# Patient Record
Sex: Female | Born: 1945 | Race: White | Hispanic: No | Marital: Married | Smoking: Current every day smoker
Health system: Southern US, Community
[De-identification: ages and names within clinical notes are randomized; demographics above are authoritative.]

## PROBLEM LIST (undated history)

## (undated) DIAGNOSIS — I10 Essential (primary) hypertension: Secondary | ICD-10-CM

## (undated) DIAGNOSIS — J449 Chronic obstructive pulmonary disease, unspecified: Secondary | ICD-10-CM

## (undated) DIAGNOSIS — G8929 Other chronic pain: Secondary | ICD-10-CM

## (undated) HISTORY — PX: FOOT SURGERY: SHX648

---

## 2004-05-12 ENCOUNTER — Emergency Department: Payer: Self-pay | Admitting: Emergency Medicine

## 2004-05-13 ENCOUNTER — Ambulatory Visit: Payer: Self-pay | Admitting: Family Medicine

## 2004-09-16 ENCOUNTER — Ambulatory Visit: Payer: Self-pay | Admitting: Anesthesiology

## 2004-10-11 ENCOUNTER — Ambulatory Visit: Payer: Self-pay | Admitting: Anesthesiology

## 2004-10-14 ENCOUNTER — Ambulatory Visit: Payer: Self-pay | Admitting: Anesthesiology

## 2004-11-11 ENCOUNTER — Emergency Department: Payer: Self-pay | Admitting: Emergency Medicine

## 2004-12-10 ENCOUNTER — Ambulatory Visit: Payer: Self-pay | Admitting: Anesthesiology

## 2005-01-06 ENCOUNTER — Ambulatory Visit: Payer: Self-pay | Admitting: Anesthesiology

## 2005-01-08 ENCOUNTER — Ambulatory Visit: Payer: Self-pay | Admitting: Anesthesiology

## 2005-02-12 ENCOUNTER — Ambulatory Visit: Payer: Self-pay | Admitting: Anesthesiology

## 2005-02-19 ENCOUNTER — Ambulatory Visit: Payer: Self-pay | Admitting: Anesthesiology

## 2005-03-27 ENCOUNTER — Ambulatory Visit: Payer: Self-pay | Admitting: Anesthesiology

## 2005-05-03 ENCOUNTER — Emergency Department: Payer: Self-pay | Admitting: General Practice

## 2005-05-03 ENCOUNTER — Other Ambulatory Visit: Payer: Self-pay

## 2005-06-19 ENCOUNTER — Ambulatory Visit: Payer: Self-pay | Admitting: Anesthesiology

## 2005-08-07 ENCOUNTER — Ambulatory Visit: Payer: Self-pay | Admitting: Anesthesiology

## 2005-10-30 ENCOUNTER — Ambulatory Visit: Payer: Self-pay | Admitting: Anesthesiology

## 2005-12-24 ENCOUNTER — Ambulatory Visit: Payer: Self-pay | Admitting: Anesthesiology

## 2006-01-01 ENCOUNTER — Ambulatory Visit: Payer: Self-pay | Admitting: Gastroenterology

## 2006-01-21 ENCOUNTER — Ambulatory Visit: Payer: Self-pay | Admitting: Anesthesiology

## 2006-07-28 ENCOUNTER — Ambulatory Visit: Payer: Self-pay | Admitting: Anesthesiology

## 2006-08-27 ENCOUNTER — Ambulatory Visit: Payer: Self-pay | Admitting: Anesthesiology

## 2006-10-08 ENCOUNTER — Ambulatory Visit: Payer: Self-pay | Admitting: Anesthesiology

## 2006-11-03 ENCOUNTER — Ambulatory Visit: Payer: Self-pay | Admitting: Anesthesiology

## 2006-12-09 ENCOUNTER — Ambulatory Visit: Payer: Self-pay | Admitting: Anesthesiology

## 2007-04-30 ENCOUNTER — Ambulatory Visit: Payer: Self-pay | Admitting: Anesthesiology

## 2007-06-07 ENCOUNTER — Ambulatory Visit: Payer: Self-pay | Admitting: Anesthesiology

## 2007-06-28 ENCOUNTER — Ambulatory Visit: Payer: Self-pay | Admitting: Anesthesiology

## 2007-07-12 ENCOUNTER — Emergency Department: Payer: Self-pay | Admitting: Emergency Medicine

## 2007-12-01 ENCOUNTER — Ambulatory Visit: Payer: Self-pay | Admitting: Anesthesiology

## 2007-12-22 ENCOUNTER — Ambulatory Visit: Payer: Self-pay | Admitting: Anesthesiology

## 2008-01-17 ENCOUNTER — Ambulatory Visit: Payer: Self-pay | Admitting: Anesthesiology

## 2008-02-14 ENCOUNTER — Ambulatory Visit: Payer: Self-pay | Admitting: Anesthesiology

## 2008-03-16 ENCOUNTER — Ambulatory Visit: Payer: Self-pay | Admitting: Anesthesiology

## 2008-09-18 ENCOUNTER — Ambulatory Visit: Payer: Self-pay | Admitting: Anesthesiology

## 2008-11-01 ENCOUNTER — Ambulatory Visit: Payer: Self-pay | Admitting: Anesthesiology

## 2008-11-29 ENCOUNTER — Ambulatory Visit: Payer: Self-pay | Admitting: Anesthesiology

## 2009-06-18 ENCOUNTER — Ambulatory Visit: Payer: Self-pay | Admitting: Anesthesiology

## 2009-08-02 ENCOUNTER — Ambulatory Visit: Payer: Self-pay | Admitting: Anesthesiology

## 2009-09-14 ENCOUNTER — Ambulatory Visit: Payer: Self-pay | Admitting: Anesthesiology

## 2009-11-08 ENCOUNTER — Ambulatory Visit: Payer: Self-pay | Admitting: Anesthesiology

## 2010-04-17 ENCOUNTER — Ambulatory Visit: Payer: Self-pay | Admitting: Anesthesiology

## 2010-05-24 ENCOUNTER — Ambulatory Visit: Payer: Self-pay | Admitting: Anesthesiology

## 2010-07-01 ENCOUNTER — Ambulatory Visit: Payer: Self-pay | Admitting: Anesthesiology

## 2010-08-01 ENCOUNTER — Ambulatory Visit: Payer: Self-pay | Admitting: Anesthesiology

## 2011-01-31 ENCOUNTER — Ambulatory Visit: Payer: Self-pay | Admitting: Anesthesiology

## 2011-03-11 ENCOUNTER — Ambulatory Visit: Payer: Self-pay | Admitting: Anesthesiology

## 2011-04-04 ENCOUNTER — Ambulatory Visit: Payer: Self-pay | Admitting: Anesthesiology

## 2011-10-01 ENCOUNTER — Ambulatory Visit: Payer: Self-pay | Admitting: Anesthesiology

## 2011-11-14 ENCOUNTER — Ambulatory Visit: Payer: Self-pay | Admitting: Anesthesiology

## 2012-01-20 ENCOUNTER — Ambulatory Visit: Payer: Self-pay | Admitting: Anesthesiology

## 2012-03-22 ENCOUNTER — Other Ambulatory Visit: Payer: Self-pay

## 2012-03-22 DIAGNOSIS — R410 Disorientation, unspecified: Secondary | ICD-10-CM

## 2012-03-26 ENCOUNTER — Ambulatory Visit
Admission: RE | Admit: 2012-03-26 | Discharge: 2012-03-26 | Disposition: A | Discharge status: Home / Self Care | Payer: Medicare PPO | Source: Ambulatory Visit

## 2012-03-26 DIAGNOSIS — R410 Disorientation, unspecified: Secondary | ICD-10-CM

## 2012-03-26 MED ORDER — GADOBENATE DIMEGLUMINE 529 MG/ML IV SOLN
8.0000 mL | Freq: Once | INTRAVENOUS | Status: AC | PRN
  Administered 2012-03-26: 8 mL via INTRAVENOUS

## 2012-04-26 ENCOUNTER — Ambulatory Visit: Payer: Self-pay | Admitting: Anesthesiology

## 2012-08-18 ENCOUNTER — Ambulatory Visit: Payer: Self-pay | Admitting: Anesthesiology

## 2012-11-16 ENCOUNTER — Ambulatory Visit: Payer: Self-pay | Admitting: Anesthesiology

## 2013-01-11 ENCOUNTER — Emergency Department: Payer: Self-pay | Admitting: Unknown Physician Specialty

## 2013-01-11 LAB — COMPREHENSIVE METABOLIC PANEL
Albumin: 3 g/dL — ABNORMAL LOW (ref 3.4–5.0)
BUN: 6 mg/dL — ABNORMAL LOW (ref 7–18)
Calcium, Total: 9.8 mg/dL (ref 8.5–10.1)
Creatinine: 0.55 mg/dL — ABNORMAL LOW (ref 0.60–1.30)
EGFR (African American): >60
Potassium: 3.4 mmol/L — ABNORMAL LOW (ref 3.5–5.1)
Sodium: 142 mmol/L (ref 136–145)
Total Protein: 7.1 g/dL (ref 6.4–8.2)

## 2013-01-11 LAB — URINALYSIS, COMPLETE
Nitrite: NEGATIVE
Protein: NEGATIVE
RBC,UR: <1 /HPF (ref 0–5)
Specific Gravity: 1.003 (ref 1.003–1.030)
WBC UR: 2 /HPF (ref 0–5)

## 2013-01-11 LAB — CK TOTAL AND CKMB (NOT AT ARMC)
CK, Total: 45 U/L (ref 21–215)
CK-MB: 1 ng/mL (ref 0.5–3.6)

## 2013-01-11 LAB — CBC
HCT: 32.9 % — ABNORMAL LOW (ref 35.0–47.0)
MCH: 27.5 pg (ref 26.0–34.0)
MCHC: 33.9 g/dL (ref 32.0–36.0)
MCV: 81 fL (ref 80–100)
Platelet: 153 10*3/uL (ref 150–440)

## 2013-01-11 LAB — TROPONIN I: Troponin-I: <0.02 ng/mL

## 2013-02-28 ENCOUNTER — Ambulatory Visit: Payer: Self-pay | Admitting: Anesthesiology

## 2013-08-04 ENCOUNTER — Ambulatory Visit: Payer: Self-pay | Admitting: Anesthesiology

## 2013-09-04 IMAGING — CR DG CHEST 1V PORT
1 series · 1 of 1 positions shown · non-contrast
Comparison: none

REASON FOR EXAM: copd
COMMENTS:

[ap]
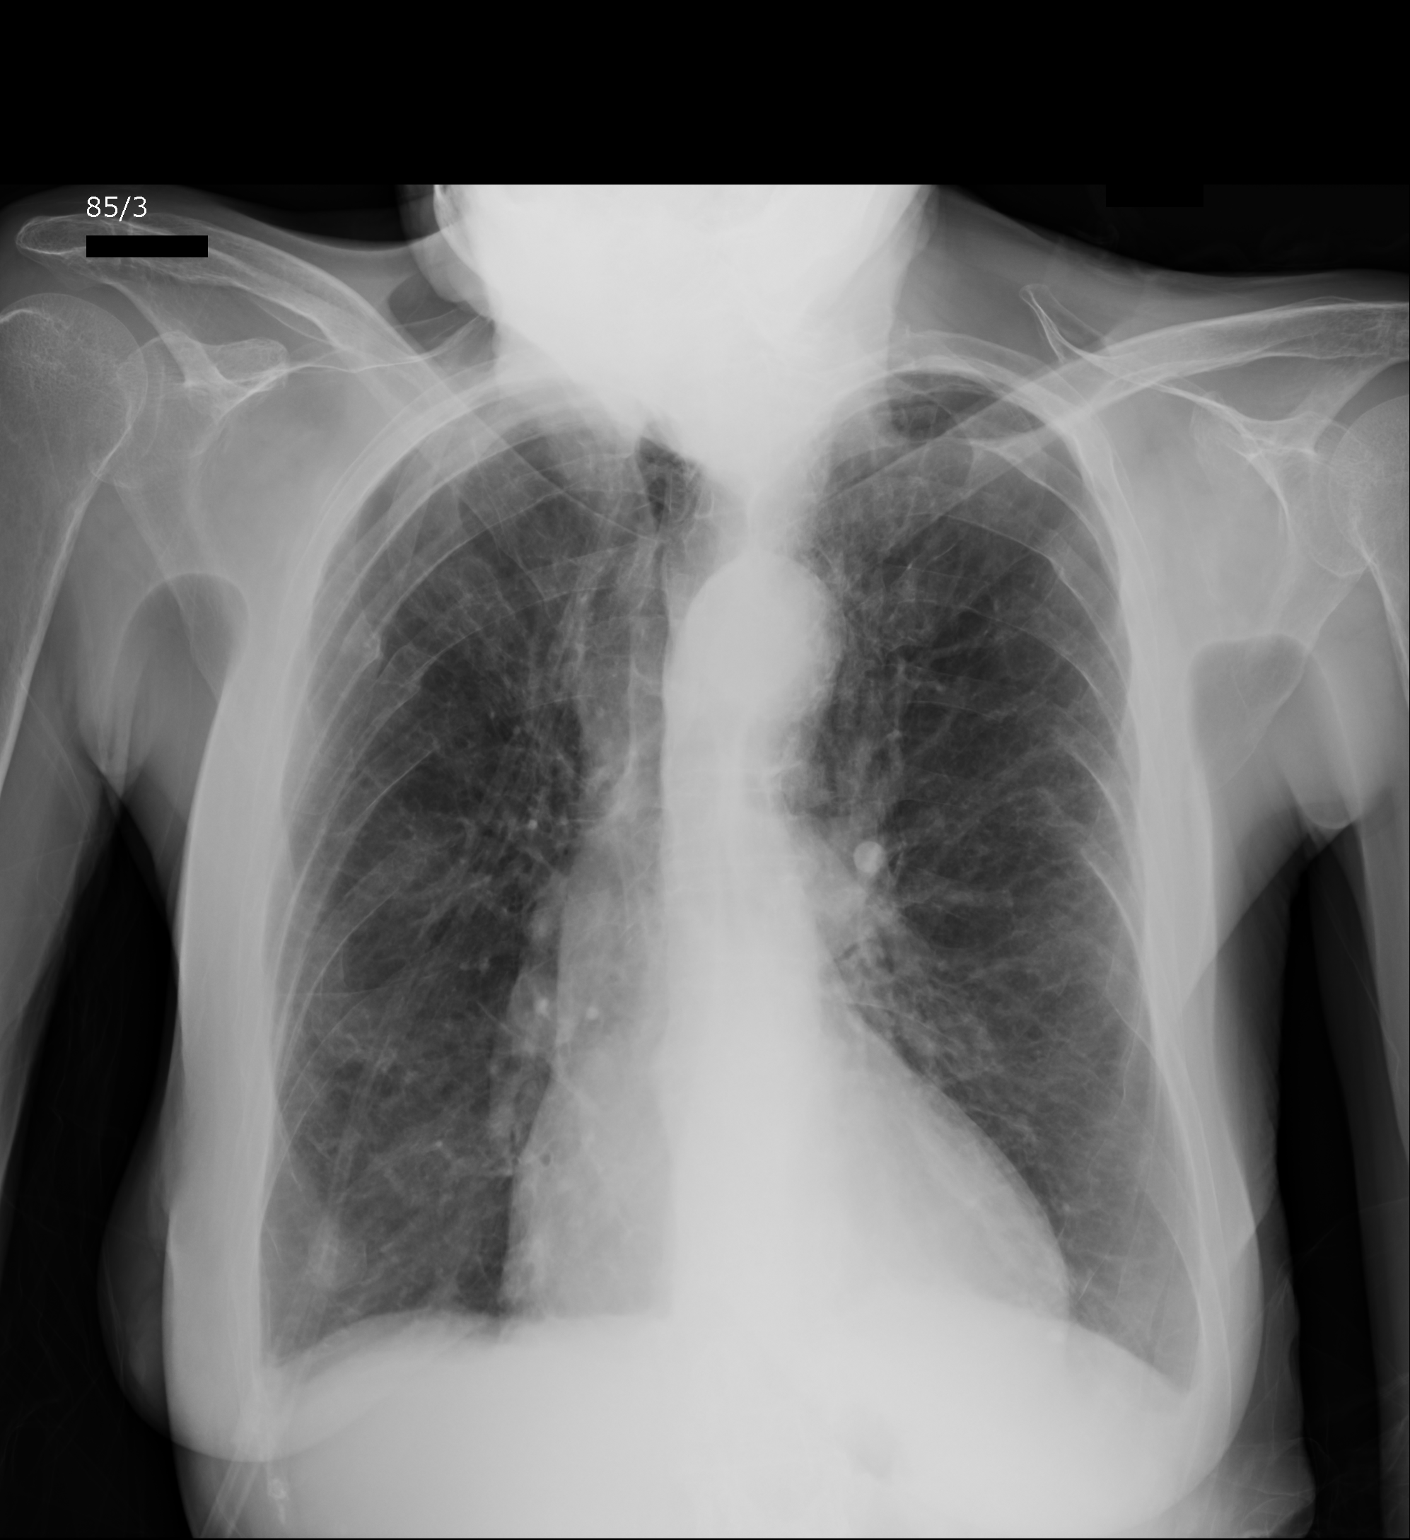

[1 of 1 positions shown; findings below may reference images not displayed]

PROCEDURE:     DXR - DXR PORTABLE CHEST SINGLE VIEW  - January 11, 2013  [DATE]

RESULT:     The lungs are hyperinflated. The interstitial markings are
increased. There are old rib fractures bilaterally. The cardiac silhouette
is top normal in size. The pulmonary vascularity is not clearly engorged.
There is no significant pleural fluid collection demonstrated.
IMPRESSION: The findings are consistent with COPD. I cannot exclude
acute bronchitis in the appropriate clinical setting. There is no focal
pneumonia.

[REDACTED]

## 2013-10-13 ENCOUNTER — Ambulatory Visit: Payer: Self-pay | Admitting: Anesthesiology

## 2014-02-14 ENCOUNTER — Ambulatory Visit: Payer: Self-pay | Admitting: Anesthesiology

## 2014-04-05 ENCOUNTER — Ambulatory Visit: Payer: Self-pay | Admitting: Anesthesiology

## 2014-07-15 ENCOUNTER — Emergency Department: Payer: Self-pay | Admitting: Internal Medicine

## 2014-07-15 LAB — CBC
HCT: 39.5 % (ref 35.0–47.0)
HGB: 13 g/dL (ref 12.0–16.0)
MCH: 31 pg (ref 26.0–34.0)
MCHC: 33 g/dL (ref 32.0–36.0)
MCV: 94 fL (ref 80–100)
PLATELETS: 374 10*3/uL (ref 150–440)
RBC: 4.21 10*6/uL (ref 3.80–5.20)
RDW: 13.8 % (ref 11.5–14.5)
WBC: 8.2 10*3/uL (ref 3.6–11.0)

## 2014-07-15 LAB — DRUG SCREEN, URINE

## 2014-07-15 LAB — COMPREHENSIVE METABOLIC PANEL
ALK PHOS: 314 U/L — AB
ANION GAP: 5 — AB (ref 7–16)
Albumin: 3.7 g/dL (ref 3.4–5.0)
BILIRUBIN TOTAL: 0.5 mg/dL (ref 0.2–1.0)
BUN: 9 mg/dL (ref 7–18)
CALCIUM: 10.6 mg/dL — AB (ref 8.5–10.1)
CHLORIDE: 103 mmol/L (ref 98–107)
CO2: 32 mmol/L (ref 21–32)
Creatinine: 0.78 mg/dL (ref 0.60–1.30)
Glucose: 107 mg/dL — ABNORMAL HIGH (ref 65–99)
OSMOLALITY: 279 (ref 275–301)
Potassium: 3.5 mmol/L (ref 3.5–5.1)
SGOT(AST): 25 U/L (ref 15–37)
SGPT (ALT): 16 U/L
SODIUM: 140 mmol/L (ref 136–145)
Total Protein: 8.7 g/dL — ABNORMAL HIGH (ref 6.4–8.2)

## 2014-07-15 LAB — URINALYSIS, COMPLETE
BACTERIA: NONE SEEN
BLOOD: NEGATIVE
Bilirubin,UR: NEGATIVE
Glucose,UR: NEGATIVE mg/dL (ref 0–75)
KETONE: NEGATIVE
Leukocyte Esterase: NEGATIVE
NITRITE: NEGATIVE
Ph: 5 (ref 4.5–8.0)
Protein: NEGATIVE
RBC,UR: <1 /HPF (ref 0–5)
SPECIFIC GRAVITY: 1.01 (ref 1.003–1.030)
Squamous Epithelial: NONE SEEN

## 2014-07-15 LAB — SALICYLATE LEVEL: Salicylates, Serum: 1.9 mg/dL

## 2014-07-15 LAB — ACETAMINOPHEN LEVEL

## 2014-07-15 LAB — ETHANOL: Ethanol: <3 mg/dL

## 2014-11-08 NOTE — Consult Note (Signed)
PATIENT NAME:  Carolyn Malone, Carolyn Malone MR#:  [id number] DATE OF BIRTH:  07-04-1946  DATE OF CONSULTATION:  07/15/2014  CONSULTING PHYSICIAN:  Audery Amel, MD  IDENTIFYING INFORMATION AND REASON FOR CONSULTATION: A 69 year old woman brought in voluntarily to the hospital by her family with concerns about depression.   CHIEF COMPLAINT: "I'm not feeling too good."   HISTORY OF PRESENT ILLNESS: Information obtained from the patient and the chart. The patient states that her husband died on Aug 03, 2023. She is having a hard time adjusting. She says that she has been feeling sad and overwhelmed. She has not been sleeping well. Has not been eating well. She has had the occasional auditory hallucination of her husband speaking, but has insight into the unreality of this. She denies that she has been having any suicidal thoughts at all. Does not want to die. Feels committed to her family. Does not have any homicidal ideation. She has been taking medication prescribed by her primary care doctor, but has not been back to see the primary care doctor since her husband passed away.   PAST PSYCHIATRIC HISTORY: Says that she has been feeling bad for a while and her primary care doctor has tried to get her to see a psychiatrist and she has resisted somewhat until now. Denies any history of psychiatric hospitalization. Denies any history of suicide attempts.   FAMILY HISTORY: Knows of no family history of mental illness.   SOCIAL HISTORY: She is living with extended family, including her daughter and granddaughter. Husband died just a week ago.   SUBSTANCE ABUSE HISTORY: Denies alcohol or drug use. No past history known of alcohol or drug use.   PAST MEDICAL HISTORY: COPD, underweight. High blood pressure. Also, chronic pain and has been followed in the chronic pain clinic for a long time.   CURRENT MEDICATIONS: Hydrocodone/acetaminophen 5 mg 1 tablet twice a day as needed, ibuprofen 800 mg 3 times a day x 5 days only,  enalapril 2.5 mg once a day, clonazepam 0.5 mg once a day as needed, atenolol 25 mg once a day at night, Advair Diskus 250 mcg 2 times inhaled twice a day, Spiriva HandiHaler inhaled once a day, Norvasc 10 mg once a day, ranitidine 75 mg twice a day, oxygen normally at home 2 liters.   ALLERGIES: No known drug allergies.   REVIEW OF SYSTEMS: Sad mood, poor sleep, poor appetite. No suicidal or homicidal ideation.   MENTAL STATUS EXAMINATION: Chronically ill woman, somewhat disheveled, interviewed in a hospital bed. Eye contact good. Psychomotor activity slow and limited. Speech however was normal in rate, tone and volume. Affect was reactive, mildly dysphoric but not tearful or overwhelmed. Mood was stated as not so good. Thoughts are lucid. No evidence of loosening of associations or delusions. Has had 1 or 2 episodes of hearing her husband's voice. No other hallucinations. Denies suicidal or homicidal ideation completely. She is alert and oriented x 4. Able to repeat 3 objects immediately and remembers 2/3 at 3 minutes. Judgment and insight appears to be intact. Normal intelligence.   LABORATORY RESULTS: Drug screen positive for tricyclic antidepressants and benzodiazepines. Negative for any opiates. Urinalysis negative. Salicylates normal. CBC normal. Alcohol negative. Chemistry panel: Elevated alkaline 314, elevated total protein. Acetaminophen negative.   VITAL SIGNS: Blood pressure 156/79, respirations 17, pulse 92, temperature 96.8.   ASSESSMENT: This is a 69 year old woman who has symptoms of depression and grieving, to be expected after her husband died. She admits that she has not been  eating well and has not been sleeping well. She totally denies any suicidal ideation. She is lucid with no sign of psychosis. She states that she knows she needs to be eating better and plans to start eating well. She is planning to go see her primary care doctor in the next few days.   TREATMENT PLAN: The  patient does not require inpatient hospitalization for psychiatric illness, does not meet commitment criteria. Psychoeducation and supportive counseling done including the strong recommendation that she needs to be eating and drinking better and following up with her primary care doctor. From a psychiatric standpoint, she does not require enforced hospitalization; however, I have discussed the case with the emergency room doctor who can evaluate whether there are more concerns about her weight.   DIAGNOSIS, PRINCIPAL AND PRIMARY:   AXIS I:  1. Depression, not otherwise specified.  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   AXIS II: Deferred.   AXIS III:  1. Underweight.  2. Chronic pain.  3. Hypertension.     ____________________________ Audery AmelJohn T. Windle Huebert, MD jtc:TT D: 07/15/2014 19:06:22 ET T: 07/15/2014 19:34:11 ET JOB#: [id number]  cc: Audery AmelJohn T. Reizy Dunlow, MD, <Dictator> Audery AmelJOHN T Allisen Pidgeon MD ELECTRONICALLY SIGNED 07/17/2014 15:16

## 2015-04-04 ENCOUNTER — Encounter: Payer: Self-pay | Admitting: *Deleted

## 2015-04-04 ENCOUNTER — Inpatient Hospital Stay
Admission: EM | Admit: 2015-04-04 | Discharge: 2015-04-14 | DRG: 871 | Disposition: E | Payer: Medicare PPO | Attending: Internal Medicine | Admitting: Internal Medicine

## 2015-04-04 ENCOUNTER — Emergency Department: Payer: Medicare PPO

## 2015-04-04 DIAGNOSIS — E785 Hyperlipidemia, unspecified: Secondary | ICD-10-CM | POA: Diagnosis present

## 2015-04-04 DIAGNOSIS — Z515 Encounter for palliative care: Secondary | ICD-10-CM

## 2015-04-04 DIAGNOSIS — Z79899 Other long term (current) drug therapy: Secondary | ICD-10-CM

## 2015-04-04 DIAGNOSIS — E872 Acidosis, unspecified: Secondary | ICD-10-CM | POA: Diagnosis present

## 2015-04-04 DIAGNOSIS — E162 Hypoglycemia, unspecified: Secondary | ICD-10-CM | POA: Diagnosis present

## 2015-04-04 DIAGNOSIS — F419 Anxiety disorder, unspecified: Secondary | ICD-10-CM | POA: Diagnosis present

## 2015-04-04 DIAGNOSIS — G9341 Metabolic encephalopathy: Secondary | ICD-10-CM | POA: Diagnosis present

## 2015-04-04 DIAGNOSIS — I10 Essential (primary) hypertension: Secondary | ICD-10-CM | POA: Diagnosis present

## 2015-04-04 DIAGNOSIS — R6521 Severe sepsis with septic shock: Secondary | ICD-10-CM | POA: Diagnosis present

## 2015-04-04 DIAGNOSIS — E875 Hyperkalemia: Secondary | ICD-10-CM | POA: Diagnosis present

## 2015-04-04 DIAGNOSIS — R680 Hypothermia, not associated with low environmental temperature: Secondary | ICD-10-CM | POA: Diagnosis present

## 2015-04-04 DIAGNOSIS — D72829 Elevated white blood cell count, unspecified: Secondary | ICD-10-CM

## 2015-04-04 DIAGNOSIS — K219 Gastro-esophageal reflux disease without esophagitis: Secondary | ICD-10-CM | POA: Diagnosis present

## 2015-04-04 DIAGNOSIS — Z9889 Other specified postprocedural states: Secondary | ICD-10-CM

## 2015-04-04 DIAGNOSIS — T68XXXA Hypothermia, initial encounter: Secondary | ICD-10-CM | POA: Diagnosis present

## 2015-04-04 DIAGNOSIS — F17210 Nicotine dependence, cigarettes, uncomplicated: Secondary | ICD-10-CM | POA: Diagnosis present

## 2015-04-04 DIAGNOSIS — A483 Toxic shock syndrome: Secondary | ICD-10-CM

## 2015-04-04 DIAGNOSIS — Z809 Family history of malignant neoplasm, unspecified: Secondary | ICD-10-CM

## 2015-04-04 DIAGNOSIS — J9621 Acute and chronic respiratory failure with hypoxia: Secondary | ICD-10-CM | POA: Diagnosis present

## 2015-04-04 DIAGNOSIS — G8929 Other chronic pain: Secondary | ICD-10-CM | POA: Diagnosis present

## 2015-04-04 DIAGNOSIS — J189 Pneumonia, unspecified organism: Secondary | ICD-10-CM | POA: Diagnosis present

## 2015-04-04 DIAGNOSIS — I447 Left bundle-branch block, unspecified: Secondary | ICD-10-CM | POA: Diagnosis present

## 2015-04-04 DIAGNOSIS — Z7951 Long term (current) use of inhaled steroids: Secondary | ICD-10-CM

## 2015-04-04 DIAGNOSIS — N179 Acute kidney failure, unspecified: Secondary | ICD-10-CM | POA: Diagnosis present

## 2015-04-04 DIAGNOSIS — J9600 Acute respiratory failure, unspecified whether with hypoxia or hypercapnia: Secondary | ICD-10-CM | POA: Diagnosis present

## 2015-04-04 DIAGNOSIS — Z9981 Dependence on supplemental oxygen: Secondary | ICD-10-CM

## 2015-04-04 DIAGNOSIS — A419 Sepsis, unspecified organism: Secondary | ICD-10-CM | POA: Diagnosis not present

## 2015-04-04 DIAGNOSIS — Z66 Do not resuscitate: Secondary | ICD-10-CM | POA: Diagnosis present

## 2015-04-04 DIAGNOSIS — N170 Acute kidney failure with tubular necrosis: Secondary | ICD-10-CM | POA: Diagnosis present

## 2015-04-04 DIAGNOSIS — N19 Unspecified kidney failure: Secondary | ICD-10-CM | POA: Diagnosis not present

## 2015-04-04 DIAGNOSIS — J449 Chronic obstructive pulmonary disease, unspecified: Secondary | ICD-10-CM | POA: Diagnosis present

## 2015-04-04 DIAGNOSIS — Z833 Family history of diabetes mellitus: Secondary | ICD-10-CM

## 2015-04-04 HISTORY — DX: Chronic obstructive pulmonary disease, unspecified: J44.9

## 2015-04-04 HISTORY — DX: Other chronic pain: G89.29

## 2015-04-04 HISTORY — DX: Essential (primary) hypertension: I10

## 2015-04-04 LAB — BASIC METABOLIC PANEL
Anion gap: 18 — ABNORMAL HIGH (ref 5–15)
BUN: 46 mg/dL — AB (ref 6–20)
CALCIUM: 9.3 mg/dL (ref 8.9–10.3)
CO2: 18 mmol/L — ABNORMAL LOW (ref 22–32)
Chloride: 106 mmol/L (ref 101–111)
Creatinine, Ser: 2.62 mg/dL — ABNORMAL HIGH (ref 0.44–1.00)
GFR calc Af Amer: 20 mL/min — ABNORMAL LOW (ref >60)
GFR, EST NON AFRICAN AMERICAN: 18 mL/min — AB (ref >60)
GLUCOSE: 142 mg/dL — AB (ref 65–99)
POTASSIUM: 6 mmol/L — AB (ref 3.5–5.1)
SODIUM: 142 mmol/L (ref 135–145)

## 2015-04-04 LAB — CBC WITH DIFFERENTIAL/PLATELET
Basophils Absolute: 0 10*3/uL (ref 0–0.1)
Basophils Relative: 0 %
Eosinophils Absolute: 0 10*3/uL (ref 0–0.7)
Eosinophils Relative: 0 %
HEMATOCRIT: 38.7 % (ref 35.0–47.0)
HEMOGLOBIN: 12 g/dL (ref 12.0–16.0)
LYMPHS ABS: 1.6 10*3/uL (ref 1.0–3.6)
LYMPHS PCT: 7 %
MCH: 30.4 pg (ref 26.0–34.0)
MCHC: 31 g/dL — AB (ref 32.0–36.0)
MCV: 97.9 fL (ref 80.0–100.0)
MONOS PCT: 5 %
Monocytes Absolute: 1.1 10*3/uL — ABNORMAL HIGH (ref 0.2–0.9)
NEUTROS ABS: 20.6 10*3/uL — AB (ref 1.4–6.5)
NEUTROS PCT: 88 %
Platelets: 198 10*3/uL (ref 150–440)
RBC: 3.95 MIL/uL (ref 3.80–5.20)
RDW: 15.7 % — ABNORMAL HIGH (ref 11.5–14.5)
WBC: 23.3 10*3/uL — AB (ref 3.6–11.0)

## 2015-04-04 LAB — URINALYSIS COMPLETE WITH MICROSCOPIC (ARMC ONLY)
Bilirubin Urine: NEGATIVE
Glucose, UA: NEGATIVE mg/dL
KETONES UR: NEGATIVE mg/dL
Leukocytes, UA: NEGATIVE
Nitrite: NEGATIVE
PH: 5 (ref 5.0–8.0)
PROTEIN: 100 mg/dL — AB
SPECIFIC GRAVITY, URINE: 1.014 (ref 1.005–1.030)

## 2015-04-04 LAB — LACTIC ACID, PLASMA: LACTIC ACID, VENOUS: 8.2 mmol/L — AB (ref 0.5–2.0)

## 2015-04-04 LAB — BLOOD GAS, ARTERIAL
ACID-BASE DEFICIT: 13.9 mmol/L — AB (ref 0.0–2.0)
Allens test (pass/fail): POSITIVE — AB
Bicarbonate: 13.9 mEq/L — ABNORMAL LOW (ref 21.0–28.0)
O2 SAT: 41.2 %
PATIENT TEMPERATURE: 37
PCO2 ART: 38 mmHg (ref 32.0–48.0)
pH, Arterial: 7.17 — CL (ref 7.350–7.450)
pO2, Arterial: 31 mmHg — CL (ref 83.0–108.0)

## 2015-04-04 LAB — GLUCOSE, CAPILLARY
GLUCOSE-CAPILLARY: 71 mg/dL (ref 65–99)
GLUCOSE-CAPILLARY: 116 mg/dL — AB (ref 65–99)
Glucose-Capillary: 181 mg/dL — ABNORMAL HIGH (ref 65–99)

## 2015-04-04 MED ORDER — DEXTROSE 50 % IV SOLN: 25.0000 mL | INTRAVENOUS | Status: DC

## 2015-04-04 MED ORDER — VANCOMYCIN HCL IN DEXTROSE 1-5 GM/200ML-% IV SOLN
1000.0000 mg | Freq: Once | INTRAVENOUS | Status: AC
Start: 2015-04-04 — End: 2015-04-04
  Administered 2015-04-04: 1000 mg via INTRAVENOUS
  Filled 2015-04-04: qty 200

## 2015-04-04 MED ORDER — MORPHINE SULFATE (PF) 2 MG/ML IV SOLN: 2.0000 mg | INTRAVENOUS | Status: DC | PRN

## 2015-04-04 MED ORDER — PROCHLORPERAZINE 25 MG RE SUPP
25.0000 mg | Freq: Two times a day (BID) | RECTAL | Status: DC | PRN
  Filled 2015-04-04: qty 1

## 2015-04-04 MED ORDER — PIPERACILLIN-TAZOBACTAM 3.375 G IVPB: 3.3750 g | Freq: Two times a day (BID) | INTRAVENOUS | Status: DC

## 2015-04-04 MED ORDER — ACETAMINOPHEN 650 MG RE SUPP: 650.0000 mg | Freq: Four times a day (QID) | RECTAL | Status: DC | PRN

## 2015-04-04 MED ORDER — ALBUTEROL SULFATE (2.5 MG/3ML) 0.083% IN NEBU: 2.5000 mg | INHALATION_SOLUTION | RESPIRATORY_TRACT | Status: DC | PRN

## 2015-04-04 MED ORDER — SODIUM CHLORIDE 0.9 % IV BOLUS (SEPSIS)
1000.0000 mL | Freq: Once | INTRAVENOUS | Status: AC
  Administered 2015-04-04: 1000 mL via INTRAVENOUS

## 2015-04-04 MED ORDER — MORPHINE SULFATE (PF) 2 MG/ML IV SOLN: 1.0000 mg | INTRAVENOUS | Status: DC | PRN

## 2015-04-04 MED ORDER — PIPERACILLIN-TAZOBACTAM 3.375 G IVPB
3.3750 g | Freq: Once | INTRAVENOUS | Status: DC
  Administered 2015-04-04: 3.375 g via INTRAVENOUS
  Filled 2015-04-04: qty 50

## 2015-04-04 MED ORDER — MORPHINE SULFATE (CONCENTRATE) 10 MG/0.5ML PO SOLN: 5.0000 mg | ORAL | Status: DC | PRN

## 2015-04-04 MED ORDER — ALBUTEROL SULFATE HFA 108 (90 BASE) MCG/ACT IN AERS: 2.0000 | INHALATION_SPRAY | RESPIRATORY_TRACT | Status: DC | PRN

## 2015-04-04 MED ORDER — GLYCOPYRROLATE 0.2 MG/ML IJ SOLN
0.4000 mg | Freq: Four times a day (QID) | INTRAMUSCULAR | Status: DC | PRN
  Filled 2015-04-04: qty 2

## 2015-04-04 MED ORDER — SODIUM CHLORIDE 0.9 % IV SOLN
1.0000 g | INTRAVENOUS | Status: DC
  Filled 2015-04-04: qty 10

## 2015-04-04 MED ORDER — ONDANSETRON HCL 4 MG/2ML IJ SOLN: 4.0000 mg | Freq: Four times a day (QID) | INTRAMUSCULAR | Status: DC | PRN

## 2015-04-04 MED ORDER — LORAZEPAM 2 MG/ML IJ SOLN: 0.5000 mg | INTRAMUSCULAR | Status: DC | PRN

## 2015-04-04 MED ORDER — ONDANSETRON HCL 4 MG PO TABS: 4.0000 mg | ORAL_TABLET | Freq: Four times a day (QID) | ORAL | Status: DC | PRN

## 2015-04-04 MED ORDER — SODIUM CHLORIDE 0.9 % IV SOLN: INTRAVENOUS | Status: DC

## 2015-04-04 MED ORDER — INSULIN ASPART 100 UNIT/ML ~~LOC~~ SOLN: 10.0000 [IU] | SUBCUTANEOUS | Status: DC

## 2015-04-04 MED ORDER — HEPARIN SODIUM (PORCINE) 5000 UNIT/ML IJ SOLN: 5000.0000 [IU] | Freq: Three times a day (TID) | INTRAMUSCULAR | Status: DC

## 2015-04-04 MED ORDER — MOMETASONE FURO-FORMOTEROL FUM 200-5 MCG/ACT IN AERO
2.0000 | INHALATION_SPRAY | Freq: Two times a day (BID) | RESPIRATORY_TRACT | Status: DC
  Filled 2015-04-04: qty 8.8

## 2015-04-04 MED ORDER — IPRATROPIUM BROMIDE 0.02 % IN SOLN: 0.5000 mg | Freq: Four times a day (QID) | RESPIRATORY_TRACT | Status: DC | PRN

## 2015-04-04 MED ORDER — TIOTROPIUM BROMIDE MONOHYDRATE 18 MCG IN CAPS
18.0000 ug | ORAL_CAPSULE | Freq: Every day | RESPIRATORY_TRACT | Status: DC
  Filled 2015-04-04: qty 5

## 2015-04-04 MED ORDER — DEXTROSE 50 % IV SOLN
1.0000 | Freq: Once | INTRAVENOUS | Status: AC
  Administered 2015-04-04: 50 mL via INTRAVENOUS
  Filled 2015-04-04: qty 50

## 2015-04-04 MED ORDER — BISACODYL 10 MG RE SUPP: 10.0000 mg | Freq: Every day | RECTAL | Status: DC | PRN

## 2015-04-04 MED ORDER — ALBUTEROL SULFATE (2.5 MG/3ML) 0.083% IN NEBU: 2.5000 mg | INHALATION_SOLUTION | Freq: Three times a day (TID) | RESPIRATORY_TRACT | Status: DC

## 2015-04-04 MED ORDER — VANCOMYCIN HCL 500 MG IV SOLR: 500.0000 mg | INTRAVENOUS | Status: DC

## 2015-04-04 MED ORDER — ACETAMINOPHEN 325 MG PO TABS: 650.0000 mg | ORAL_TABLET | Freq: Four times a day (QID) | ORAL | Status: DC | PRN

## 2015-04-05 LAB — URINE CULTURE: Culture: NO GROWTH

## 2015-04-09 LAB — CULTURE, BLOOD (ROUTINE X 2)
CULTURE: NO GROWTH
Culture: NO GROWTH

## 2015-04-14 NOTE — Progress Notes (Signed)
Dr. Imogene Burn notified of pts death at 09-Dec-1703, chaplain paged, supervisor notified. Casimer Leek, RN 05-02-2015

## 2015-04-14 NOTE — Progress Notes (Signed)
   Apr 28, 2015 1420  Clinical Encounter Type  Visited With Patient and family together  Visit Type Initial;Spiritual support;Patient actively dying  Referral From Nurse  Consult/Referral To Chaplain  Spiritual Encounters  Spiritual Needs Sacred text;Prayer;Ritual;Emotional;Grief support  Stress Factors  Patient Stress Factors Health changes;Loss;Exhausted  Family Stress Factors Family relationships;Health changes;Loss;Major life changes  Met w/patient & family. Family acknowledged patient is actively dying and requested unction for the dying (last rites), prayer for family and grief care. Provided per family request.   Melven Sartorius. Margie Brink G. Winthrop

## 2015-04-14 NOTE — ED Notes (Signed)
Dr. Derrill Kay notified that nurse unable to doppler pedal pulses but able to doppler right popliteal and left femoral.  Dr. Derrill Kay also notified of continued hypotension and change in mental status.  Bair hugger applied and Warm IV fluids to be given.

## 2015-04-14 NOTE — Progress Notes (Signed)
As mentioned in H&P, the patient was admitted for septic shock, pneumonia, acute renal failure, hyperlipidemia, acute respiratory failure and hypothermia. The patient was treated with normal saline bolus, vancomycin and Zosyn in the ED. After Dr. Orvan Falconer discussed with the patient's family member, they decided comfort care only. The patient was put on comfort care with oxygen and morphine when necessary. She expired at 17:05 per RN.  Discharge diagnosis:  Septic shock Right lower lobe Pneumonia (CAP) Acute respiratory failure with hypoxia Acute metabolic encephalopathy Acute renal failure, possible ATN Hyperkalemia Acute acidosis Hypothermia

## 2015-04-14 NOTE — ED Notes (Signed)
Spoke with Dr. Derrill Kay about patients continued hypothermia, hypotension, and unable to obtain 02 stat.  Verbal orders received.

## 2015-04-14 NOTE — Consult Note (Signed)
Palliative Medicine Inpatient Consult Note   Name: Carolyn Malone Date: Apr 08, 2015 MRN: [id number]  DOB: 1945/07/22  Referring Physician: Demetrios Loll, MD  Palliative Care consult requested for this 69 y.o. female for goals of medical therapy in patient with severe acidosis due to pneumonia and metabolic derangement associated with acute renal failure.  She came into the ER with shortness of breath and is found to have labs and vital signs that are not consistent with staying alive. She is in hypotensive shock with an SBP around 50 or so.  She got a dose of Vanco and Zosyn in the ER and some fluids. Family had wanted to treat the high potassium, run fluids, and give ABX BUT pt is DNR and w/o intubation and pressors, these measures would not make any difference.  Her attending asked that I speak with family to clarify their concept of terminal comfort care and clarify what we should do, should not do, etc in this setting.   IMPRESSION Sepsis with Septic Shock ---severe sepsis present on admission ---likely due to pneumonia ---organism not known ---multi organ system failure underway due to sepsis with      -respiratory failure and renal failure and metabolic encephalopathy Pneumonia RLL Ongoing tobacco smoking Severe COPD  --on 4 LPM of oxygen at home and very debilitated from her COPD Acute on Chronic Respiratory Failure with Hypoxemia (has h/o Hypercapnia but this is not noted at admission) Essential HTN normally now in shock Hypoglycemia Hypothermia Leukocytosis due to sepsis Acute renal failure with Cr 2.62 Hyperkalemia Left Bundle Branch Block on EKG Chronic Pain on oral narcotics at home Anxiety disorder GERD  DISCUSSIONS, DECISIONS, AND PLAN: 1. Pt continues as DNR and DNI  2.  After I met with all three daughters and their spouses / children present today, the family is in agreement to 'let pt die naturally'. They no longer insist on fluids, ABX, potassium treatment, etc.  They  are accepting of her terminal condition and express comfort knowing she will not be suffering any longer as she has been suffering.  Small doses of morphine are discussed and no one is objecting to medications for comfort and to help with a sensation of smothering.  See revised orders.  Grim prognosis. I would not expect pt to survive the night.  However, she did get some boluses of fluid and enough ABX to stay in her system today, so it is hard to say if these measures will prolong her life, or not.     REVIEW OF SYSTEMS:  Patient is not able to provide ROS due to actively dying.  SPIRITUAL SUPPORT SYSTEM: Yes   -family and chaplain .  SOCIAL HISTORY:  reports that she has been smoking.  She does not have any smokeless tobacco history on file. She reports that she does not drink alcohol.  Lives with one daughter.  Performance status at home is very poor --able to walk but unable to open a door due to weakness.  On 4 LPM of O2 at baseline.  Sick with shortness of breath for 2-3 days. Had some intermittent vomiting with traces of blood in it yesterday per report.  LEGAL DOCUMENTS:  [name] DNR form came with pt  CODE STATUS: DNR  PAST MEDICAL HISTORY: Past Medical History  Diagnosis Date  . COPD (chronic obstructive pulmonary disease)   . Chronic pain   . Hypertension     PAST SURGICAL HISTORY:  Past Surgical History  Procedure Laterality Date  . Foot  surgery      ALLERGIES:  has No Known Allergies.  MEDICATIONS:  Current Facility-Administered Medications  Medication Dose Route Frequency Provider Last Rate Last Dose  . 0.9 %  sodium chloride infusion   Intravenous Continuous Colleen Can, MD      . bisacodyl (DULCOLAX) suppository 10 mg  10 mg Rectal Daily PRN Colleen Can, MD      . glycopyrrolate (ROBINUL) injection 0.4 mg  0.4 mg Intravenous Q6H PRN Colleen Can, MD      . LORazepam (ATIVAN) injection 0.5 mg  0.5 mg Intravenous Q4H PRN Colleen Can,  MD      . morphine 2 MG/ML injection 1 mg  1 mg Intravenous Q1H PRN Colleen Can, MD      . morphine 2 MG/ML injection 2 mg  2 mg Intravenous Q1H PRN Colleen Can, MD      . morphine CONCENTRATE 10 MG/0.5ML oral solution 5 mg  5 mg Oral Q2H PRN Colleen Can, MD      . ondansetron [facility]) injection 4 mg  4 mg Intravenous Q6H PRN Colleen Can, MD      . prochlorperazine (COMPAZINE) suppository 25 mg  25 mg Rectal Q12H PRN Colleen Can, MD       Current Outpatient Prescriptions  Medication Sig Dispense Refill  . acetaminophen (TYLENOL) 500 MG tablet Take 500 mg by mouth 2 (two) times daily.    Marland Kitchen albuterol (PROVENTIL HFA;VENTOLIN HFA) 108 (90 BASE) MCG/ACT inhaler Inhale 2 puffs into the lungs every 4 (four) hours as needed for wheezing or shortness of breath.    Marland Kitchen albuterol (PROVENTIL) (2.5 MG/3ML) 0.083% nebulizer solution Take 2.5 mg by nebulization 3 (three) times daily.    . diclofenac sodium (VOLTAREN) 1 % GEL Apply 2 g topically 4 (four) times daily as needed (back or leg pain).    . Fluticasone-Salmeterol (ADVAIR) 500-50 MCG/DOSE AEPB Inhale 1 puff into the lungs 2 (two) times daily.    Marland Kitchen gabapentin (NEURONTIN) 300 MG capsule Take 300 mg by mouth 2 (two) times daily. In the morning and at lunchtime    . hydrOXYzine (VISTARIL) 25 MG capsule Take 25 mg by mouth 3 (three) times daily as needed for anxiety.    Marland Kitchen ipratropium (ATROVENT) 0.02 % nebulizer solution Take 0.5 mg by nebulization every 6 (six) hours as needed for wheezing or shortness of breath.    . mirtazapine (REMERON) 30 MG tablet Take 30 mg by mouth at bedtime.    . nortriptyline (PAMELOR) 10 MG capsule Take 10 mg by mouth at bedtime.    Marland Kitchen OLANZapine (ZYPREXA) 2.5 MG tablet Take 2.5-5 mg by mouth 2 (two) times daily. 2.5 mg every morning, 5 mg at bedtime    . oxyCODONE (OXY IR/ROXICODONE) 5 MG immediate release tablet Take 5 mg by mouth daily as needed for severe pain.    . ranitidine (ZANTAC) 150  MG tablet Take 150 mg by mouth 2 (two) times daily.    Marland Kitchen tiotropium (SPIRIVA) 18 MCG inhalation capsule Place 18 mcg into inhaler and inhale daily.      Vital Signs: BP 60/39 mmHg  Pulse 65  Temp(Src) 95.8 F (35.4 C) (Rectal)  Resp 27  Ht 5' (1.524 m)  Wt 34.473 kg (76 lb)  BMI 14.84 kg/m2  SpO2 92% Filed Weights   Apr 20, 2015 1048  Weight: 34.473 kg (76 lb)    Estimated body mass index is 14.84 kg/(m^2) as calculated from the  following:   Height as of this encounter: 5' (1.524 m).   Weight as of this encounter: 34.473 kg (76 lb).  PERFORMANCE STATUS (ECOG) : 4 - Bedbound  PHYSICAL EXAM: Lying flat in bed w/ O2 face mask in place Unresponsive but limbs move from time to time Eyes closed OPdry NEck w/o JVD or TM Hrt rrr rate 70's --PVCs on tele and irreg beats heard Lungs with ronchi and decreased BS right base ABd soft and NT with BS Ext early cyanosis is noted w/o obvious mottling (yet)   LABS: CBC:    Component Value Date/Time   WBC 23.3* 26-Apr-2015 1137   WBC 8.2 07/15/2014 1257   HGB 12.0 04/26/15 1137   HGB 13.0 07/15/2014 1257   HCT 38.7 04/26/2015 1137   HCT 39.5 07/15/2014 1257   PLT 198 April 26, 2015 1137   PLT 374 07/15/2014 1257   MCV 97.9 2015/04/26 1137   MCV 94 07/15/2014 1257   NEUTROABS 20.6* Apr 26, 2015 1137   LYMPHSABS 1.6 2015-04-26 1137   MONOABS 1.1* 04/26/15 1137   EOSABS 0.0 2015-04-26 1137   BASOSABS 0.0 2015-04-26 1137   Comprehensive Metabolic Panel:    Component Value Date/Time   NA 142 26-Apr-2015 1137   NA 140 07/15/2014 1257   K 6.0* 26-Apr-2015 1137   K 3.5 07/15/2014 1257   CL 106 Apr 26, 2015 1137   CL 103 07/15/2014 1257   CO2 18* 04-26-15 1137   CO2 32 07/15/2014 1257   BUN 46* Apr 26, 2015 1137   BUN 9 07/15/2014 1257   CREATININE 2.62* 04/26/15 1137   CREATININE 0.78 07/15/2014 1257   GLUCOSE 142* 04-26-15 1137   GLUCOSE 107* 07/15/2014 1257   CALCIUM 9.3 04-26-15 1137   CALCIUM 10.6* 07/15/2014 1257   AST  25 07/15/2014 1257   ALT 16 07/15/2014 1257   ALKPHOS 314* 07/15/2014 1257   BILITOT 0.5 07/15/2014 1257   PROT 8.7* 07/15/2014 1257   ALBUMIN 3.7 07/15/2014 1257    More than 50% of the visit was spent in counseling/coordination of care: Yes  Time Spent:80  minutes

## 2015-04-14 NOTE — ED Notes (Signed)
Dr. Derrill Kay at bedside talking with family about poor prognosis and desire for treatment.

## 2015-04-14 NOTE — ED Provider Notes (Signed)
[facility] Emergency Department Provider Note   ____________________________________________  Time seen: 1145  I have reviewed the triage vital signs and the nursing notes.   HISTORY  Chief Complaint Hypoglycemia   History limited by: Not Limited   HPI Carolyn Malone is a 69 y.o. female brought to the emergency department today via EMS. The patient has had some altered mental status and respiratory distress. When EMS arrived on scene blood sugar was 44. They did give an amp of D50 with repeat blood sugar of 150. Per family the patient does not have any history of diabetes and is not on any glucoses mediated medications. The patient has not appreciated any fever.  Past Medical History  Diagnosis Date  . COPD (chronic obstructive pulmonary disease)   . Chronic pain   . Hypertension     Patient Active Problem List   Diagnosis Date Noted  . Septic shock 05-03-15  . Acute respiratory failure 05-03-15  . Community acquired pneumonia May 03, 2015  . Lactic acidosis 05/03/15  . Acute renal failure 03-May-2015  . Hyperkalemia 05-03-2015  . Hypothermia 05/03/2015    Past Surgical History  Procedure Laterality Date  . Foot surgery      Current Outpatient Rx  Name  Route  Sig  Dispense  Refill  . acetaminophen (TYLENOL) 500 MG tablet   Oral   Take 500 mg by mouth 2 (two) times daily.         Marland Kitchen albuterol (PROVENTIL HFA;VENTOLIN HFA) 108 (90 BASE) MCG/ACT inhaler   Inhalation   Inhale 2 puffs into the lungs every 4 (four) hours as needed for wheezing or shortness of breath.         Marland Kitchen albuterol (PROVENTIL) (2.5 MG/3ML) 0.083% nebulizer solution   Nebulization   Take 2.5 mg by nebulization 3 (three) times daily.         . diclofenac sodium (VOLTAREN) 1 % GEL   Topical   Apply 2 g topically 4 (four) times daily as needed (back or leg pain).         . Fluticasone-Salmeterol (ADVAIR) 500-50 MCG/DOSE AEPB   Inhalation   Inhale 1 puff into  the lungs 2 (two) times daily.         Marland Kitchen gabapentin (NEURONTIN) 300 MG capsule   Oral   Take 300 mg by mouth 2 (two) times daily. In the morning and at lunchtime         . hydrOXYzine (VISTARIL) 25 MG capsule   Oral   Take 25 mg by mouth 3 (three) times daily as needed for anxiety.         Marland Kitchen ipratropium (ATROVENT) 0.02 % nebulizer solution   Nebulization   Take 0.5 mg by nebulization every 6 (six) hours as needed for wheezing or shortness of breath.         . mirtazapine (REMERON) 30 MG tablet   Oral   Take 30 mg by mouth at bedtime.         . nortriptyline (PAMELOR) 10 MG capsule   Oral   Take 10 mg by mouth at bedtime.         Marland Kitchen OLANZapine (ZYPREXA) 2.5 MG tablet   Oral   Take 2.5-5 mg by mouth 2 (two) times daily. 2.5 mg every morning, 5 mg at bedtime         . oxyCODONE (OXY IR/ROXICODONE) 5 MG immediate release tablet   Oral   Take 5 mg by mouth daily as needed  for severe pain.         . ranitidine (ZANTAC) 150 MG tablet   Oral   Take 150 mg by mouth 2 (two) times daily.         Marland Kitchen tiotropium (SPIRIVA) 18 MCG inhalation capsule   Inhalation   Place 18 mcg into inhaler and inhale daily.           Allergies Review of patient's allergies indicates no known allergies.  Family History  Problem Relation Age of Onset  . Diabetes Mother   . Cancer Father   . Cancer Brother     Social History Social History  Substance Use Topics  . Smoking status: Current Every Day Smoker  . Smokeless tobacco: None  . Alcohol Use: No    Review of Systems  Constitutional: Negative for fever. Positive for weakness Cardiovascular: Negative for chest pain. Respiratory: Positive for shortness of breath. Gastrointestinal: Negative for abdominal pain, vomiting and diarrhea. Genitourinary: Negative for dysuria. Musculoskeletal: Negative for back pain. Skin: Negative for rash. Neurological: Negative for headaches, focal weakness or numbness.   10-point ROS  otherwise negative.  ____________________________________________   PHYSICAL EXAM:  VITAL SIGNS: ED Triage Vitals  Enc Vitals Group     BP 04/11/2015 1048 62/47 mmHg     Pulse Rate April 11, 2015 1048 68     Resp Apr 11, 2015 1048 14     Temp 04/11/15 1107 97.8 F (36.6 C)     Temp Source 04/11/15 1107 Rectal     SpO2 11-Apr-2015 1107 90 %     Weight 2015-04-11 1048 76 lb (34.473 kg)     Height April 11, 2015 1048 5' (1.524 m)     Head Cir --      Peak Flow --      Pain Score 04/11/2015 1113 10   Constitutional: Alert and oriented. Appears chronically ill. Cachectic Eyes: Conjunctivae are normal. PERRL. Normal extraocular movements. ENT   Head: Normocephalic and atraumatic.   Nose: No congestion/rhinnorhea.   Mouth/Throat: Mucous membranes are moist.   Neck: No stridor. Hematological/Lymphatic/Immunilogical: No cervical lymphadenopathy. Cardiovascular: Normal rate, regular rhythm.  No murmurs, rubs, or gallops. Respiratory: Mildly increased respiratory effort. Gastrointestinal: Soft and minimally tender diffusely. Mildly distended. Genitourinary: Deferred Musculoskeletal: Normal range of motion in all extremities. No joint effusions.  No lower extremity tenderness nor edema. Neurologic:  Normal speech and language. Moving all extremities. Skin:  Skin is warm, dry and intact. No rash noted.  ____________________________________________    LABS (pertinent positives/negatives)  Labs Reviewed  CBC WITH DIFFERENTIAL/PLATELET - Abnormal; Notable for the following:    WBC 23.3 (*)    MCHC 31.0 (*)    RDW 15.7 (*)    Neutro Abs 20.6 (*)    Monocytes Absolute 1.1 (*)    All other components within normal limits  URINALYSIS COMPLETEWITH MICROSCOPIC ([facility] ONLY) - Abnormal; Notable for the following:    Color, Urine AMBER (*)    APPearance CLOUDY (*)    Hgb urine dipstick 1+ (*)    Protein, ur 100 (*)    Bacteria, UA RARE (*)    Squamous Epithelial / LPF 0-5 (*)    All other  components within normal limits  BASIC METABOLIC PANEL - Abnormal; Notable for the following:    Potassium 6.0 (*)    CO2 18 (*)    Glucose, Bld 142 (*)    BUN 46 (*)    Creatinine, Ser 2.62 (*)    GFR calc non Af Amer 18 (*)  GFR calc Af Amer 20 (*)    Anion gap 18 (*)    All other components within normal limits  LACTIC ACID, PLASMA - Abnormal; Notable for the following:    Lactic Acid, Venous 8.2 (*)    All other components within normal limits  BLOOD GAS, ARTERIAL - Abnormal; Notable for the following:    pH, Arterial 7.17 (*)    pO2, Arterial 31 (*)    Bicarbonate 13.9 (*)    Acid-base deficit 13.9 (*)    Allens test (pass/fail) POSITIVE (*)    All other components within normal limits  GLUCOSE, CAPILLARY - Abnormal; Notable for the following:    Glucose-Capillary 181 (*)    All other components within normal limits  GLUCOSE, CAPILLARY - Abnormal; Notable for the following:    Glucose-Capillary 116 (*)    All other components within normal limits  CULTURE, BLOOD (ROUTINE X 2)  CULTURE, BLOOD (ROUTINE X 2)  URINE CULTURE  GLUCOSE, CAPILLARY      ____________________________________________    RADIOLOGY  CXR IMPRESSION: Right lower lobe infiltrate concerning for pneumonia. Followup PA and lateral chest X-ray is recommended in 3-4 weeks following trial of antibiotic therapy to ensure resolution and exclude underlying malignancy. ____________________________________________   PROCEDURES  Procedure(s) performed: None  Critical Care performed: Yes, see critical care note(s)  CRITICAL CARE Performed by: Phineas Semen   Total critical care time: 45  Critical care time was exclusive of separately billable procedures and treating other patients.  Critical care was necessary to treat or prevent imminent or life-threatening deterioration.  Critical care was time spent personally by me on the following activities: development of treatment plan with  patient and/or surrogate as well as nursing, discussions with consultants, evaluation of patient's response to treatment, examination of patient, obtaining history from patient or surrogate, ordering and performing treatments and interventions, ordering and review of laboratory studies, ordering and review of radiographic studies, pulse oximetry and re-evaluation of patient's condition.  ____________________________________________   INITIAL IMPRESSION / ASSESSMENT AND PLAN / ED COURSE  Pertinent labs & imaging results that were available during my care of the patient were reviewed by me and considered in my medical decision making (see chart for details).  Patient presented to the emergency department with altered mental status and shortness of breath. On exam she was awake and alert. Initial blood pressure was low. Patient was also relatively hypo-glycemic here in the 70s so I did asked that another D50 being administered. Patient was afebrile initially. Initially unclear etiology of altered mental size however given hypoglycemia that was a consideration. Blood work was sent as well as a lactate given concern for possible sepsis. Both the lactate in the white blood cell and the serum were severely elevated. Broad-spectrum antibiotics were ordered however given elevation patient does have high mortality. Furthermore she then started having more severe hypotension that would require pressors to maintain a good map. I did have a discussion with the family and it was clear that the patient would not have wanted a central line or invasive procedures performed. She is a DNR/DNI. The chest x-ray did come back with a possible pneumonia and thus the likely source of the sepsis. Patient was admitted to the hospitalist for further care.  ____________________________________________   FINAL CLINICAL IMPRESSION(S) / ED DIAGNOSES  Final diagnoses:  Pneumonia, organism unspecified  Sepsis, due to unspecified  organism  Hypoglycemia     Phineas Semen, MD 25-Apr-2015 [phone]

## 2015-04-14 NOTE — ED Notes (Signed)
Pt with AMS and blood sugar of 44 per [facility].  Pt given 1 amp D50 IV with repeat CBG of 150. Pt now sleepy easy to arouse with CBG 71.

## 2015-04-14 NOTE — ED Notes (Signed)
Patient continues with hypotension.  Dr. Imogene Burn aware and has verbalized that he spoke with family about vasopressors.  Per Dr. Imogene Burn family is waiting on one more family member before decision is made regarding vasopressors.

## 2015-04-14 NOTE — H&P (Addendum)
[facility] - [place] [facility]   PATIENT NAME: Carolyn Malone    MR#:  [id number]  DATE OF BIRTH:  1945-12-28  DATE OF ADMISSION:  Apr 07, 2015  PRIMARY CARE PHYSICIAN: No primary care provider on file.   REQUESTING/REFERRING PHYSICIAN: Dr. Derrill Kay  CHIEF COMPLAINT:   Chief Complaint  Patient presents with  . Hypoglycemia   altered mental status.  HISTORY OF PRESENT ILLNESS:  Blanca Carreon  is a 69 y.o. female with a known history of COPD, hypertension and chronic pain. Patient was brought to the ED from home due to confusion and altered mental status. The patient is confused and unresponsive to verbal stimuli, unable to provide any information. According to her daughter and Dr. Derrill Kay, the patient was noticed to have confusion and altered mental status. She was found to have a low blood sugar at 44 by EMS and was given D50 one dose, repeated blood sugar was 150. The patient has white count 23,000, chest x-ray show right lower lobe pneumonia. Creatinine is up to 2.62 and BUN is up to 46, Potassium 6.0. Lactic acid 8.2. ABG show pH 7.17, PO2 31, PCO2 38. She was treated with vancomycin and Zosyn. She is on normal saline bolus and warm blankets now.  PAST MEDICAL HISTORY:   Past Medical History  Diagnosis Date  . COPD (chronic obstructive pulmonary disease)   . Chronic pain   . Hypertension     PAST SURGICAL HISTORY:   Past Surgical History  Procedure Laterality Date  . Foot surgery      SOCIAL HISTORY:   Social History  Substance Use Topics  . Smoking status: Current Every Day Smoker  . Smokeless tobacco: Not on file  . Alcohol Use: No    FAMILY HISTORY:   Family History  Problem Relation Age of Onset  . Diabetes Mother   . Cancer Father   . Cancer Brother     DRUG ALLERGIES:  No Known Allergies  REVIEW OF SYSTEMS:  Patient is a confused and unresponsive to verbal stimuli, unable to get a ROS.   MEDICATIONS AT HOME:   Prior to  Admission medications   Medication Sig Start Date End Date Taking? Authorizing Provider  acetaminophen (TYLENOL) 500 MG tablet Take 500 mg by mouth 2 (two) times daily.   Yes Historical Provider, MD  albuterol (PROVENTIL HFA;VENTOLIN HFA) 108 (90 BASE) MCG/ACT inhaler Inhale 2 puffs into the lungs every 4 (four) hours as needed for wheezing or shortness of breath.   Yes Historical Provider, MD  albuterol (PROVENTIL) (2.5 MG/3ML) 0.083% nebulizer solution Take 2.5 mg by nebulization 3 (three) times daily.   Yes Historical Provider, MD  diclofenac sodium (VOLTAREN) 1 % GEL Apply 2 g topically 4 (four) times daily as needed (back or leg pain).   Yes Historical Provider, MD  Fluticasone-Salmeterol (ADVAIR) 500-50 MCG/DOSE AEPB Inhale 1 puff into the lungs 2 (two) times daily.   Yes Historical Provider, MD  gabapentin (NEURONTIN) 300 MG capsule Take 300 mg by mouth 2 (two) times daily. In the morning and at lunchtime   Yes Historical Provider, MD  hydrOXYzine (VISTARIL) 25 MG capsule Take 25 mg by mouth 3 (three) times daily as needed for anxiety.   Yes Historical Provider, MD  ipratropium (ATROVENT) 0.02 % nebulizer solution Take 0.5 mg by nebulization every 6 (six) hours as needed for wheezing or shortness of breath.   Yes Historical Provider, MD  mirtazapine (REMERON) 30 MG tablet Take 30 mg by  mouth at bedtime.   Yes Historical Provider, MD  nortriptyline (PAMELOR) 10 MG capsule Take 10 mg by mouth at bedtime.   Yes Historical Provider, MD  OLANZapine (ZYPREXA) 2.5 MG tablet Take 2.5-5 mg by mouth 2 (two) times daily. 2.5 mg every morning, 5 mg at bedtime   Yes Historical Provider, MD  oxyCODONE (OXY IR/ROXICODONE) 5 MG immediate release tablet Take 5 mg by mouth daily as needed for severe pain.   Yes Historical Provider, MD  ranitidine (ZANTAC) 150 MG tablet Take 150 mg by mouth 2 (two) times daily.   Yes Historical Provider, MD  tiotropium (SPIRIVA) 18 MCG inhalation capsule Place 18 mcg into inhaler  and inhale daily.   Yes Historical Provider, MD      VITAL SIGNS:  Blood pressure 63/47, pulse 65, temperature 95.8 F (35.4 C), temperature source Rectal, resp. rate 22, height 5' (1.524 m), weight 34.473 kg (76 lb), SpO2 92 %.  PHYSICAL EXAMINATION:  GENERAL:  69 y.o.-year-old patient lying in the bed with critical you looking. Very thin. EYES: Pupils equal, round, reactive to light and accommodation. No scleral icterus. Extraocular muscles intact.  HEENT: Head atraumatic, normocephalic. Unable to get oral exam. NECK:  Supple, no jugular venous distention. No thyroid enlargement, no tenderness.  LUNGS: Normal breath sounds bilaterally, crackles on the right base.. No use of accessory muscles of respiration.  CARDIOVASCULAR: S1, S2 normal. No murmurs, rubs, or gallops.  ABDOMEN: Soft, nontender, nondistended. Bowel sounds present. No organomegaly or mass.  EXTREMITIES: No pedal edema, cyanosis, or clubbing.  NEUROLOGIC: The patient is confused and not able to exam.  PSYCHIATRIC: The patient is confused and unresponsive to verbal stimuli. SKIN: No obvious rash, lesion, or ulcer.   LABORATORY PANEL:   CBC  Recent Labs Lab 2015/05/04 1137  WBC 23.3*  HGB 12.0  HCT 38.7  PLT 198   ------------------------------------------------------------------------------------------------------------------  Chemistries   Recent Labs Lab 05-04-15 1137  NA 142  K 6.0*  CL 106  CO2 18*  GLUCOSE 142*  BUN 46*  CREATININE 2.62*  CALCIUM 9.3   ------------------------------------------------------------------------------------------------------------------  Cardiac Enzymes No results for input(s): TROPONINI in the last 168 hours. ------------------------------------------------------------------------------------------------------------------  RADIOLOGY:  Dg Chest Portable 1 View  2015-05-04   CLINICAL DATA:  Shortness of breath, possible pneumonia  EXAM: PORTABLE CHEST - 1 VIEW   COMPARISON:  01/11/2013  FINDINGS: Heart size upper normal and stable. Stable hyperinflation and uncoiling of the aorta. Chronic perihilar bronchitic change stable. Mild diffuse interstitial prominence stable, upon which is superimposed mildly increased opacity in the medial right lower lobe.  IMPRESSION: Right lower lobe infiltrate concerning for pneumonia. Followup PA and lateral chest X-ray is recommended in 3-4 weeks following trial of antibiotic therapy to ensure resolution and exclude underlying malignancy.   Electronically Signed   By: Esperanza Heir M.D.   On: 2015/05/04 12:21    EKG:   Orders placed or performed in visit on 07/15/14  . EKG 12-Lead    IMPRESSION AND PLAN:   Septic shock Right lower lobe Pneumonia (CAP) Acute respiratory failure with hypoxia Acute metabolic encephalopathy Acute renal failure, possible ATN Hyperkalemia Acute acidosis Hypothermia  The patient will be admitted to stepdown unit. I will continue normal saline bolus, Levophed drip when necessary if patient's daughter agree. Continue vancomycin and Zosyn, follow-up blood culture and CBC. Continue nonrebreather oxygen, NEB. For hyperkalemia, I will continue calcium, NovoLog and D50 stat, follow-up potassium level. Continue IV fluid support and follow-up BMP. For hypothermia, continue  warm blanket.  The patient is in very critical condition and has very poor prognosis. She may have cardiac pulmonary arrest any time. I will request palliative care consult. I discussed with the patient's daughter, she said she is waiting for her sister's decision about Levophed drip and a further medical treatment.  All the records are reviewed and case discussed with ED provider, Dr. Derrill Kay. Management plans discussed with the patient's daughter and they are in agreement.  CODE STATUS: DO NOT RESUSCITATE  TOTAL CRITICAL TIME TAKING CARE OF THIS PATIENT: 73 minutes.   I discussed with the the patient's 2  daughters and other family members just now. They decided no Levophed drip but comfort care. However, they want IV fluid support, treatment of hyperkalemia and antibiotics. I called and discussed with the Dr. Orvan Falconer, palliative care physician, about the patient's condition and poor prognosis. She will talk with the patient's family members.  Shaune Pollack M.D on 29-Apr-2015 at 1:51 PM  Between 7am to 6pm - Pager - [phone]  After 6pm go to [email or web address] - password [email or web address] [facility]  [place] [facility]  Office  [phone]  CC: Primary care physician; No primary care provider on file.

## 2015-04-14 NOTE — Discharge Summary (Signed)
[facility] - [place] [facility]   PATIENT NAME: Carolyn Malone    MR#:  [id number]  DATE OF BIRTH:  1946-07-01  DATE OF ADMISSION:  04/06/2015 ADMITTING PHYSICIAN: Shaune Pollack, MD  DATE OF death: 2015/04/06 17:05 PRIMARY CARE PHYSICIAN: No primary care provider on file.    ADMISSION DIAGNOSIS:  Pneumonia, organism unspecified [J18.9] Hypoglycemia [E16.2] Sepsis, due to unspecified organism [A41.9]   DISCHARGE DIAGNOSIS:  Septic shock Right lower lobe Pneumonia (CAP) Acute respiratory failure with hypoxia Acute metabolic encephalopathy Acute renal failure, possible ATN Hyperkalemia Acute acidosis Hypothermia  SECONDARY DIAGNOSIS:   Past Medical History  Diagnosis Date  . COPD (chronic obstructive pulmonary disease)   . Chronic pain   . Hypertension     HOSPITAL COURSE:   As mentioned in H&P, the patient was admitted for septic shock, pneumonia, acute renal failure, hyperlipidemia, acute respiratory failure and hypothermia. The patient was treated with normal saline bolus, vancomycin and Zosyn in the ED. After Dr. Orvan Falconer discussed with the patient's family member, they decided comfort care only. The patient was put on comfort care with oxygen and morphine when necessary. She expired at 17:05 per RN.   DISCHARGE CONDITIONS:  Expired  CONSULTS OBTAINED:    palliative care   CODE STATUS:     Code Status Orders        Start     Ordered   Apr 06, 2015 1621  Do not attempt resuscitation (DNR)   Continuous    Question Answer Comment  In the event of cardiac or respiratory ARREST Do not call a "code blue"   In the event of cardiac or respiratory ARREST Do not perform Intubation, CPR, defibrillation or ACLS   In the event of cardiac or respiratory ARREST Use medication by any route, position, wound care, and other measures to relive pain and suffering. May use oxygen, suction and manual treatment of airway obstruction as needed for comfort.      06-Apr-2015 1620       Shaune Pollack M.D on 2015-04-06 at 6:41 PM  Between 7am to 6pm - Pager - [phone]  After 6pm go to [email or web address] - password [email or web address] [facility]  [place] [facility]  Office  [phone]  CC: Primary care physician; No primary care provider on file.

## 2015-04-14 NOTE — Progress Notes (Signed)
ANTIBIOTIC CONSULT NOTE - INITIAL  Pharmacy Consult for Vancomycin and Zosyn Indication: Sepsis  No Known Allergies  Patient Measurements: Height: 5' (152.4 cm) Weight: 76 lb (34.473 kg) IBW/kg (Calculated) : 45.5   Vital Signs: Temp: 95.8 F (35.4 C) (09/21 1245) Temp Source: Rectal (09/21 1245) BP: 58/41 mmHg (09/21 1345) Pulse Rate: 65 (09/21 1133) Intake/Output from previous day:   Intake/Output from this shift:    Labs:  Recent Labs  04/13/15 1137  WBC 23.3*  HGB 12.0  PLT 198  CREATININE 2.62*   Estimated Creatinine Clearance: 11 mL/min (by C-G formula based on Cr of 2.62). No results for input(s): VANCOTROUGH, VANCOPEAK, VANCORANDOM, GENTTROUGH, GENTPEAK, GENTRANDOM, TOBRATROUGH, TOBRAPEAK, TOBRARND, AMIKACINPEAK, AMIKACINTROU, AMIKACIN in the last 72 hours.   Microbiology: No results found for this or any previous visit (from the past 720 hour(s)).  Medical History: Past Medical History  Diagnosis Date  . COPD (chronic obstructive pulmonary disease)   . Chronic pain   . Hypertension     Medications:  Scheduled:   Assessment: 69 yo female admitted for septic shock.  Pharmacy consulted for vanc and zosyn recommendations.  Pt currently in acute renal failure; anticipate improvement in renal function during treatment.   Dosing Weight: 34.5kg Ke: 0.0135 T1/2: 51 hours Vd: 24.15 L Scr: 2.62 (Baseline <1) CrCl: 11 ml/min   Goal of Therapy:  Vancomycin trough level 15-20 mcg/ml  Plan:  Pt received Vancomycin and Zosyn x1 in ED Will start Vancomycin  IV Q48H (48 hours after initial ED dose, d/t renal function) and Zosyn 3.375g IV Q12H EI infusion.  Will check trough prior to 3rd dose (pt will not be at steady state).  Monitor renal function and change dosing recommendations as needed.   Pharmacy will continue to follow with you. Thank you.  Jacqualyn Posey, PharmD Clinical Pharmacist 04/13/2015,2:02 PM

## 2015-04-14 NOTE — ED Notes (Signed)
All family present.  Dr. Imogene Burn talking with family about goals in treatment.

## 2015-04-14 NOTE — Plan of Care (Signed)
Problem: Discharge Progression Outcomes Goal: Other Discharge Outcomes/Goals Outcome: Progressing Pt admitted from ED with comfort care orders.  Family at the bedside. Pt passed at 1705.

## 2015-04-14 NOTE — Progress Notes (Signed)
Called to room by family member. Patient with no pulse, no respirations, lack of pupillary response. Patient is DNR. Order for [employer]. Pronounced by K. Maple Hudson, RN and Ronnie Doss, RN at [phone]. Family at bedside. MD notified. Nursing Supervisor notified.  COPA notified by Nursing Supervisor.

## 2015-04-14 NOTE — ED Notes (Signed)
100% non re-breather applied due nurse being unable to obtain 02 SATs and abnormal ABG results.

## 2015-04-14 NOTE — Progress Notes (Signed)
After Dr. Orvan Falconer discussed with the patient's family member, they decided comfort care only. The patient was put on comfort care with oxygen and morphine when necessary. She expired at 17:05 per RN.

## 2015-04-14 NOTE — Progress Notes (Signed)
   2015-04-18 1704  Clinical Encounter Type  Visited With Patient and family together  Visit Type Spiritual support  Referral From Nurse  Consult/Referral To Chaplain  Spiritual Encounters  Spiritual Needs Grief support  Stress Factors  Patient Stress Factors Loss  Family Stress Factors Loss  Comforted family at loss of the pt. Chap. Karl Ito [phone]

## 2015-04-14 DEATH — deceased

## 2015-11-26 IMAGING — CR DG CHEST 1V PORT
1 series · 1 of 1 positions shown · non-contrast
Comparison: 01/11/2013

CLINICAL DATA: Shortness of breath, possible pneumonia

EXAM:
PORTABLE CHEST - 1 VIEW

[ap]
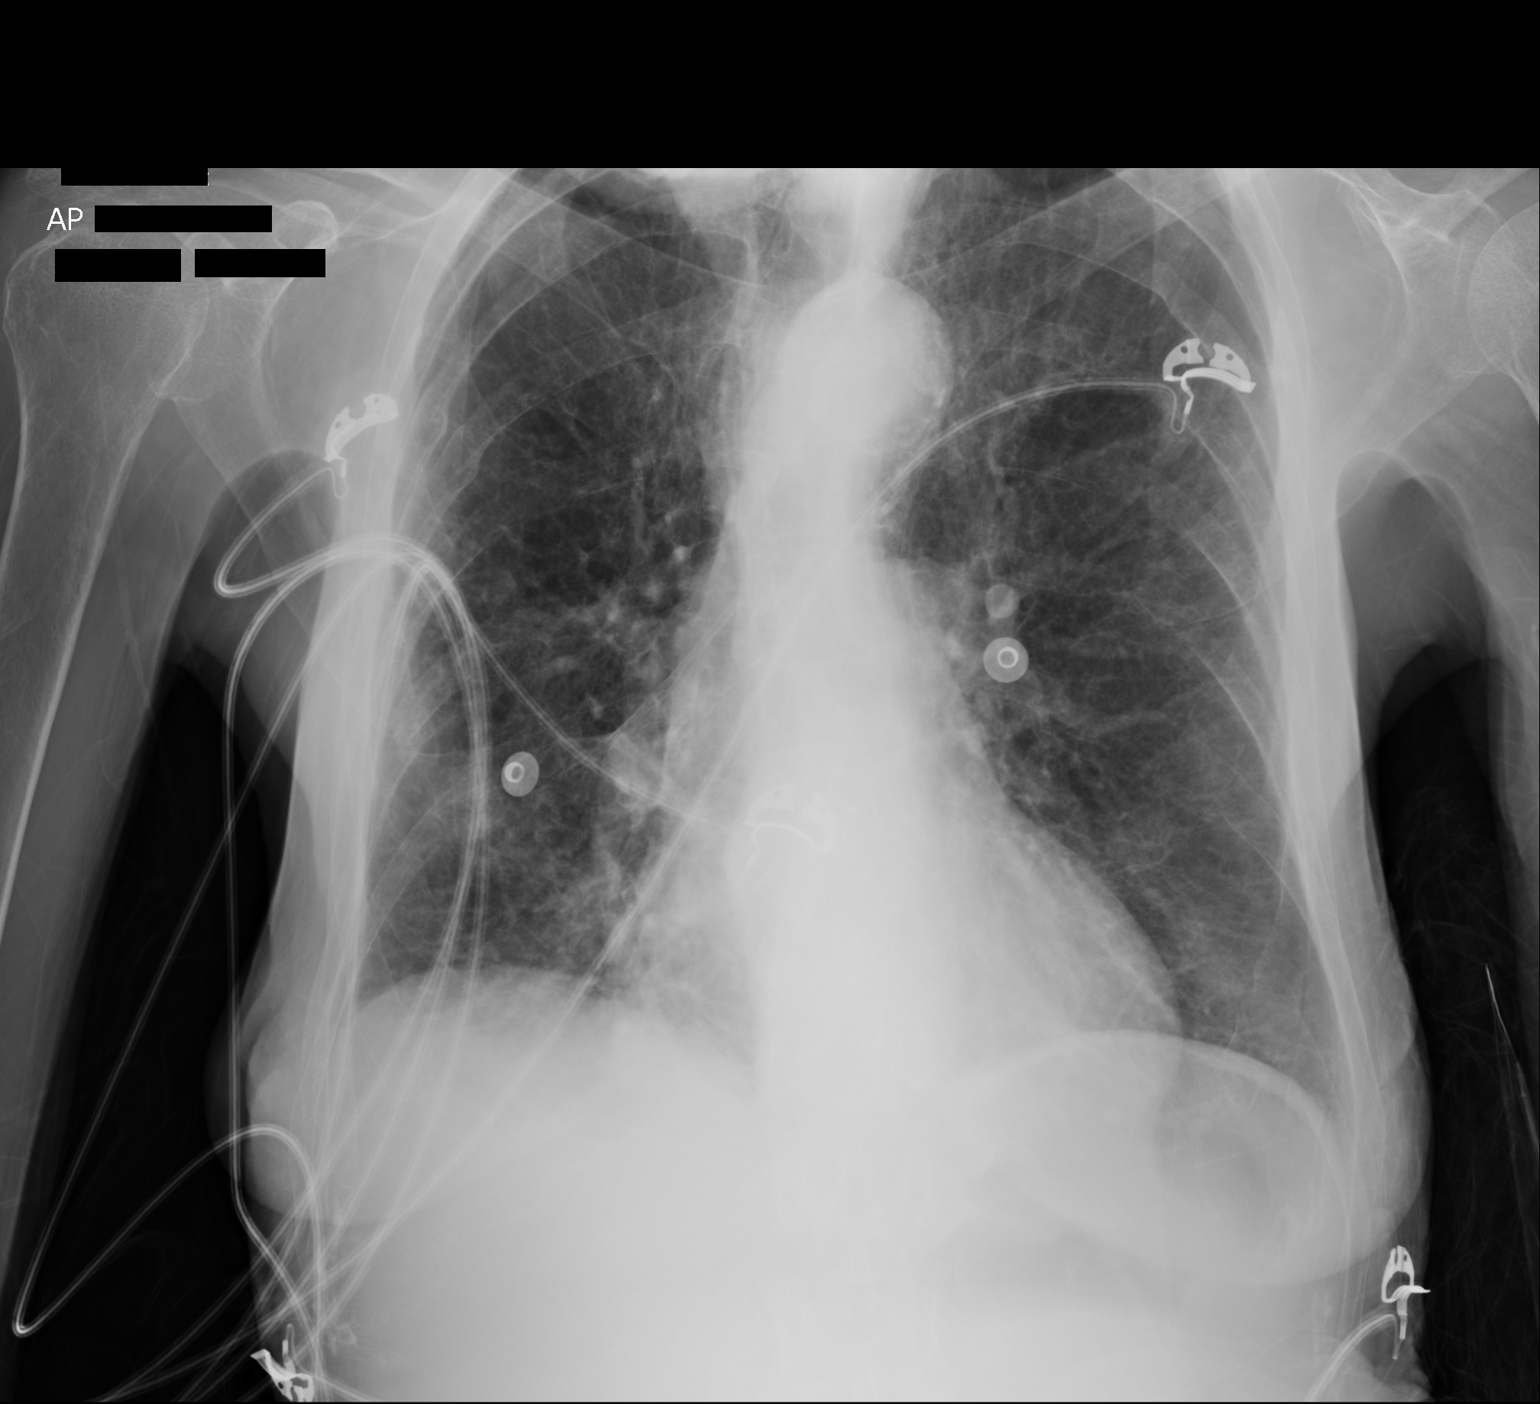

[1 of 1 positions shown; findings below may reference images not displayed]

FINDINGS: Heart size upper normal and stable. Stable hyperinflation and
uncoiling of the aorta. Chronic perihilar bronchitic change stable.
Mild diffuse interstitial prominence stable, upon which is
superimposed mildly increased opacity in the medial right lower
lobe.
IMPRESSION: Right lower lobe infiltrate concerning for pneumonia. Followup PA
and lateral chest X-ray is recommended in 3-4 weeks following trial
of antibiotic therapy to ensure resolution and exclude underlying
malignancy.
